# Patient Record
Sex: Male | Born: 1950 | Race: White | Hispanic: No | Marital: Single | Smoking: Never smoker
Health system: Southern US, Community
[De-identification: ages and names within clinical notes are randomized; demographics above are authoritative.]

## PROBLEM LIST (undated history)

## (undated) HISTORY — PX: CHOLECYSTECTOMY: SHX55

---

## 2000-08-05 ENCOUNTER — Encounter: Payer: Self-pay | Admitting: Emergency Medicine

## 2000-08-05 ENCOUNTER — Emergency Department (HOSPITAL_COMMUNITY): Admission: EM | Admit: 2000-08-05 | Discharge: 2000-08-05 | Payer: Self-pay | Admitting: Emergency Medicine

## 2009-09-13 ENCOUNTER — Ambulatory Visit (HOSPITAL_COMMUNITY): Admission: EM | Admit: 2009-09-13 | Discharge: 2009-09-15 | Payer: Self-pay | Admitting: Emergency Medicine

## 2009-09-14 ENCOUNTER — Encounter (INDEPENDENT_AMBULATORY_CARE_PROVIDER_SITE_OTHER): Payer: Self-pay | Admitting: General Surgery

## 2010-06-23 LAB — COMPREHENSIVE METABOLIC PANEL
ALT: 101 U/L — ABNORMAL HIGH (ref 0–53)
AST: 142 U/L — ABNORMAL HIGH (ref 0–37)
Albumin: 4.4 g/dL (ref 3.5–5.2)
Alkaline Phosphatase: 80 U/L (ref 39–117)
BUN: 13 mg/dL (ref 6–23)
CO2: 24 mEq/L (ref 19–32)
Calcium: 9.2 mg/dL (ref 8.4–10.5)
Chloride: 105 mEq/L (ref 96–112)
Creatinine, Ser: 0.97 mg/dL (ref 0.4–1.5)
GFR calc Af Amer: >60 mL/min (ref >60)
GFR calc non Af Amer: >60 mL/min (ref >60)
Glucose, Bld: 149 mg/dL — ABNORMAL HIGH (ref 70–99)
Potassium: 3.9 mEq/L (ref 3.5–5.1)
Sodium: 139 mEq/L (ref 135–145)
Total Bilirubin: 1.7 mg/dL — ABNORMAL HIGH (ref 0.3–1.2)
Total Protein: 6.9 g/dL (ref 6.0–8.3)

## 2010-06-23 LAB — HEPATIC FUNCTION PANEL
AST: 152 U/L — ABNORMAL HIGH (ref 0–37)
Albumin: 3.8 g/dL (ref 3.5–5.2)
Alkaline Phosphatase: 70 U/L (ref 39–117)
Bilirubin, Direct: 0.3 mg/dL (ref 0.0–0.3)
Indirect Bilirubin: 1.6 mg/dL — ABNORMAL HIGH (ref 0.3–0.9)
Total Bilirubin: 1.9 mg/dL — ABNORMAL HIGH (ref 0.3–1.2)
Total Bilirubin: 2.4 mg/dL — ABNORMAL HIGH (ref 0.3–1.2)
Total Protein: 6.1 g/dL (ref 6.0–8.3)

## 2010-06-23 LAB — URINALYSIS, ROUTINE W REFLEX MICROSCOPIC
Bilirubin Urine: NEGATIVE
Glucose, UA: NEGATIVE mg/dL
Hgb urine dipstick: NEGATIVE
Ketones, ur: 15 mg/dL — AB
Nitrite: NEGATIVE
Protein, ur: NEGATIVE mg/dL
Specific Gravity, Urine: 1.025 (ref 1.005–1.030)
Urobilinogen, UA: 0.2 mg/dL (ref 0.0–1.0)
pH: 6.5 (ref 5.0–8.0)

## 2010-06-23 LAB — CBC
HCT: 40.3 % (ref 39.0–52.0)
HCT: 46.3 % (ref 39.0–52.0)
Hemoglobin: 13.6 g/dL (ref 13.0–17.0)
Hemoglobin: 14.5 g/dL (ref 13.0–17.0)
Hemoglobin: 15.8 g/dL (ref 13.0–17.0)
MCHC: 33.8 g/dL (ref 30.0–36.0)
MCHC: 33.9 g/dL (ref 30.0–36.0)
MCHC: 34.1 g/dL (ref 30.0–36.0)
MCV: 96.1 fL (ref 78.0–100.0)
MCV: 96.4 fL (ref 78.0–100.0)
Platelets: 216 10*3/uL (ref 150–400)
RBC: 4.18 MIL/uL — ABNORMAL LOW (ref 4.22–5.81)
RBC: 4.82 MIL/uL (ref 4.22–5.81)
RDW: 12.4 % (ref 11.5–15.5)
RDW: 12.7 % (ref 11.5–15.5)
RDW: 12.8 % (ref 11.5–15.5)
WBC: 15.8 10*3/uL — ABNORMAL HIGH (ref 4.0–10.5)

## 2010-06-23 LAB — BASIC METABOLIC PANEL
CO2: 29 mEq/L (ref 19–32)
Calcium: 8.6 mg/dL (ref 8.4–10.5)
Calcium: 8.6 mg/dL (ref 8.4–10.5)
Chloride: 108 mEq/L (ref 96–112)
Creatinine, Ser: 1.06 mg/dL (ref 0.4–1.5)
GFR calc Af Amer: >60 mL/min (ref >60)
GFR calc Af Amer: >60 mL/min (ref >60)
GFR calc non Af Amer: >60 mL/min (ref >60)
Glucose, Bld: 117 mg/dL — ABNORMAL HIGH (ref 70–99)
Glucose, Bld: 128 mg/dL — ABNORMAL HIGH (ref 70–99)
Potassium: 4.7 mEq/L (ref 3.5–5.1)
Sodium: 141 mEq/L (ref 135–145)
Sodium: 142 mEq/L (ref 135–145)

## 2010-06-23 LAB — DIFFERENTIAL
Basophils Absolute: 0 10*3/uL (ref 0.0–0.1)
Basophils Relative: 0 % (ref 0–1)
Eosinophils Relative: 0 % (ref 0–5)
Lymphocytes Relative: 6 % — ABNORMAL LOW (ref 12–46)
Neutro Abs: 14.4 10*3/uL — ABNORMAL HIGH (ref 1.7–7.7)

## 2010-06-23 LAB — LIPASE, BLOOD: Lipase: 21 U/L (ref 11–59)

## 2011-10-13 IMAGING — US US ABDOMEN COMPLETE
1 series · 14 of 25 positions shown · non-contrast
Comparison: None

CLINICAL DATA: Abdominal pain.  Nausea and vomiting.

COMPLETE ABDOMINAL ULTRASOUND

[Series 1: us abdomen complete · 0.31mm/px · 14 of 100 slices shown]
[im 1/100]
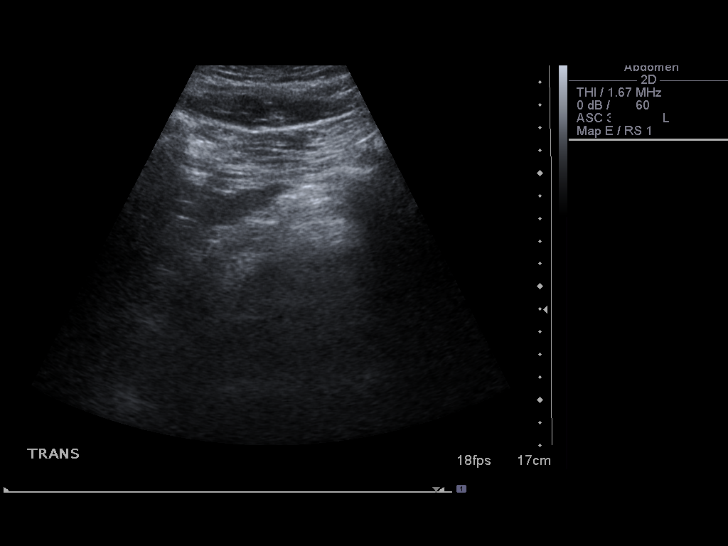
[im 9/100]
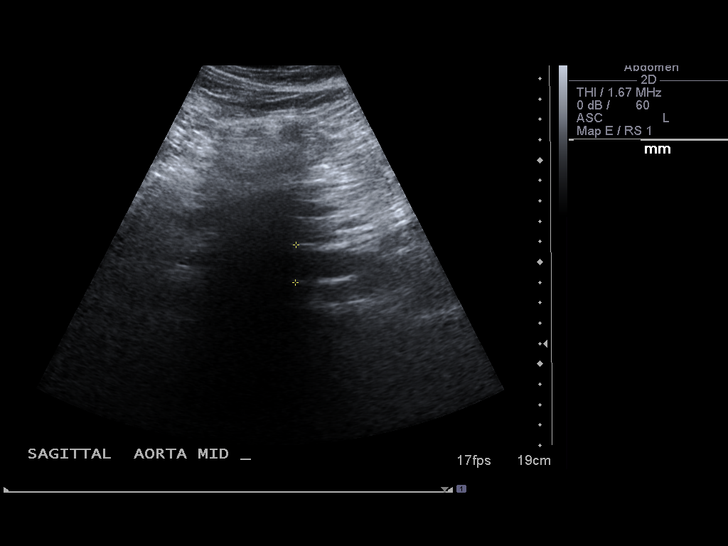
[im 17/100]
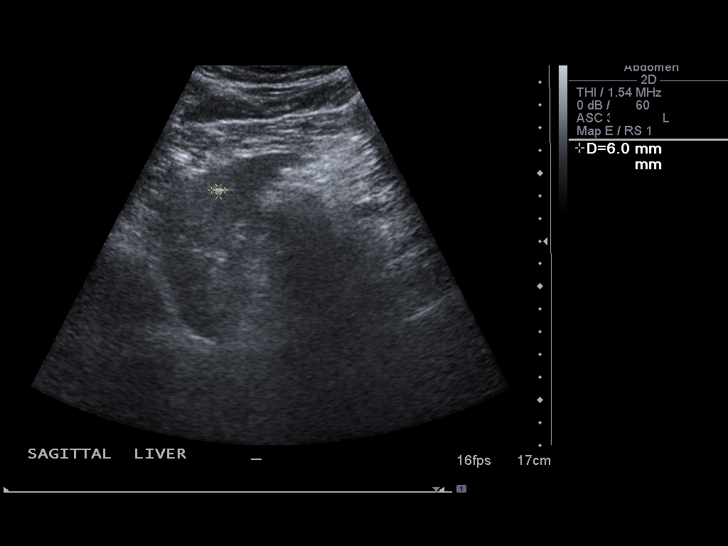
[im 25/100]
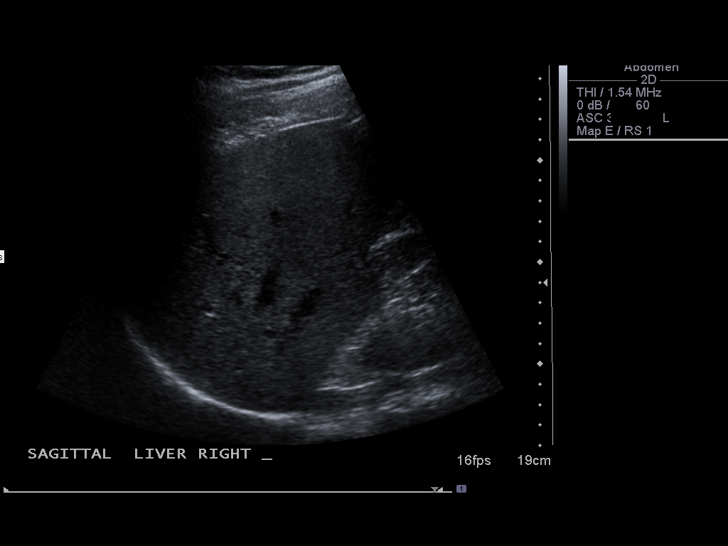
[im 34/100]
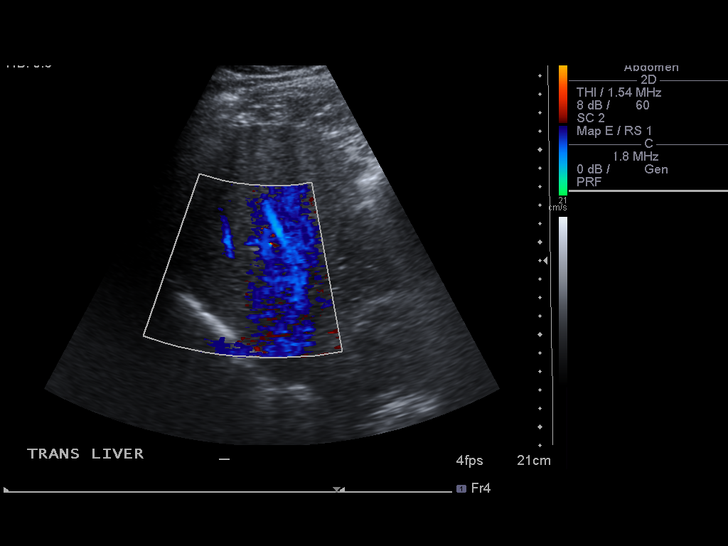
[im 38/100]
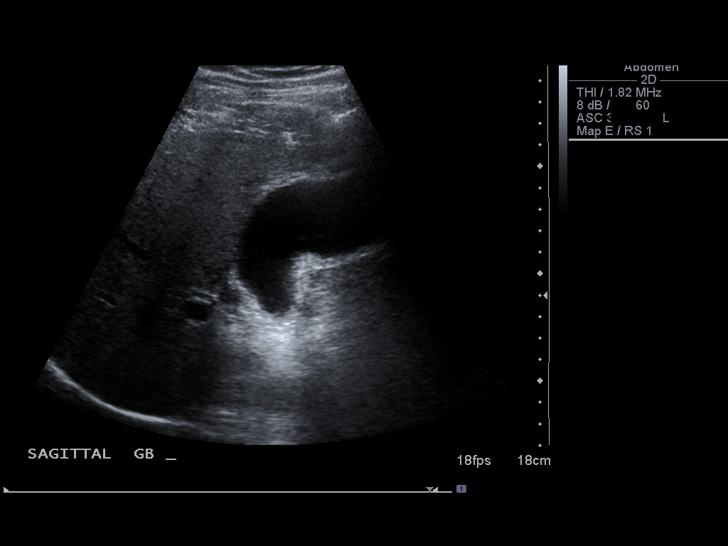
[im 46/100]
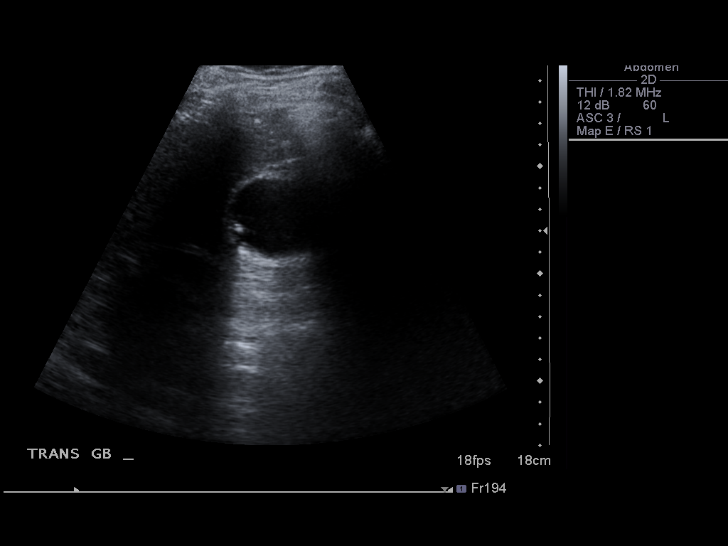
[im 54/100]
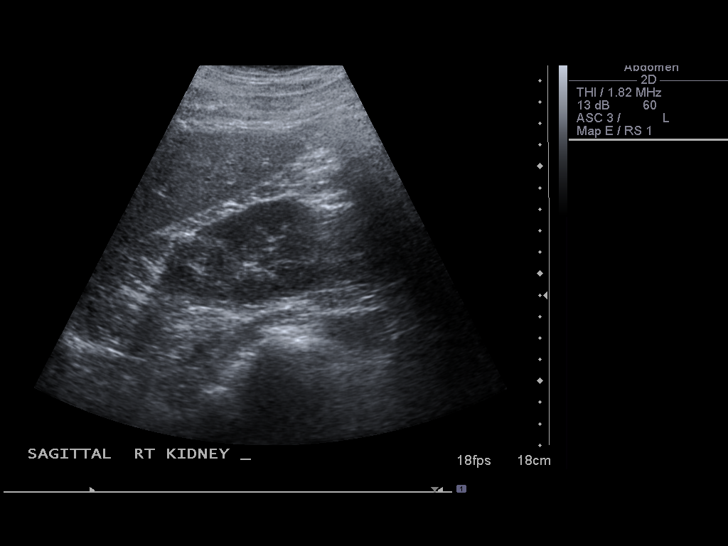
[im 62/100]
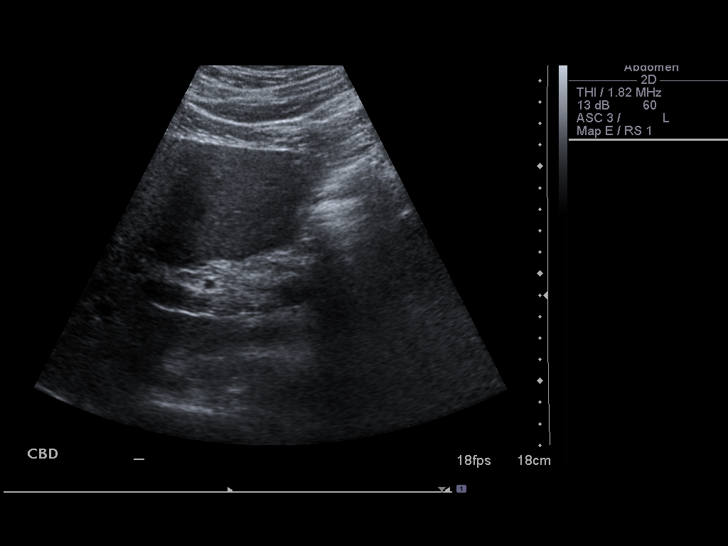
[im 67/100]
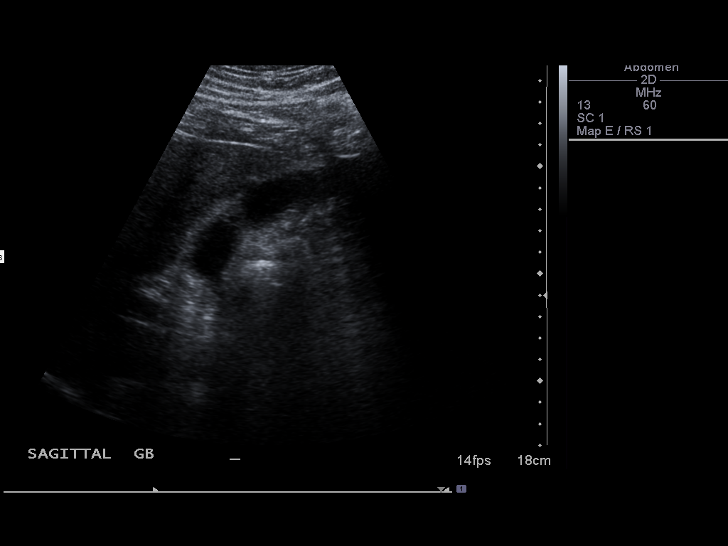
[im 75/100]
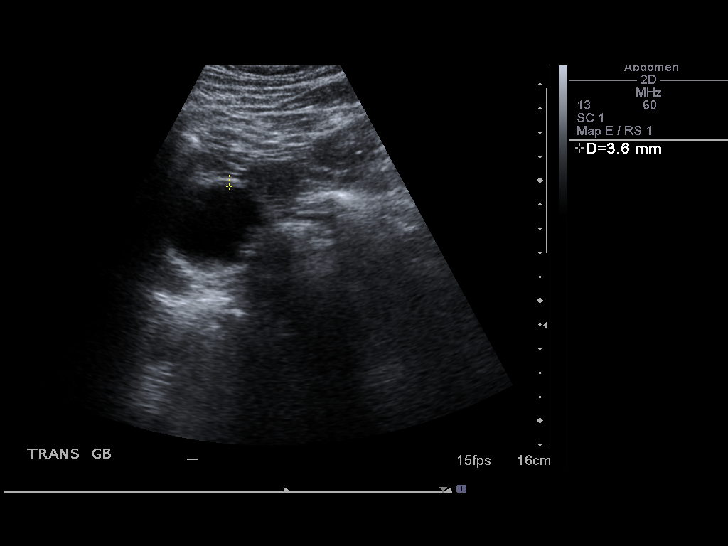
[im 83/100]
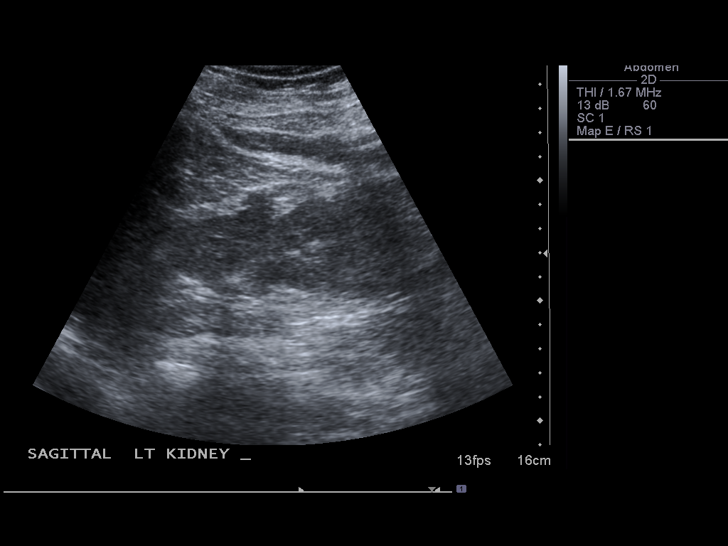
[im 91/100]
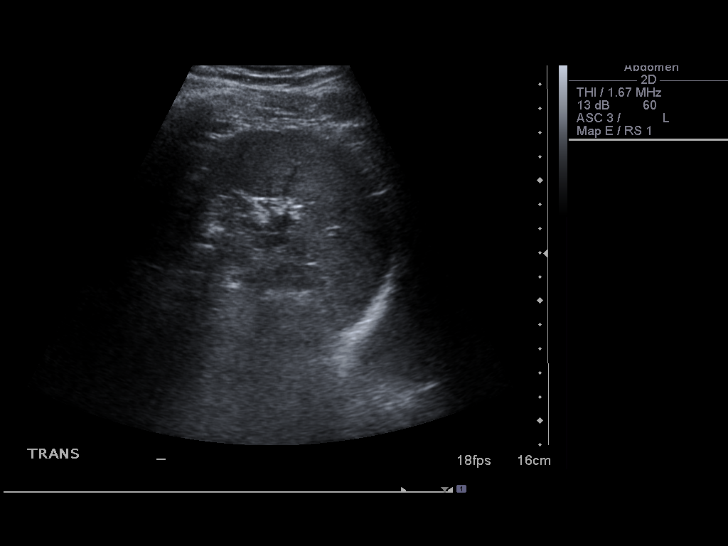
[im 100/100]
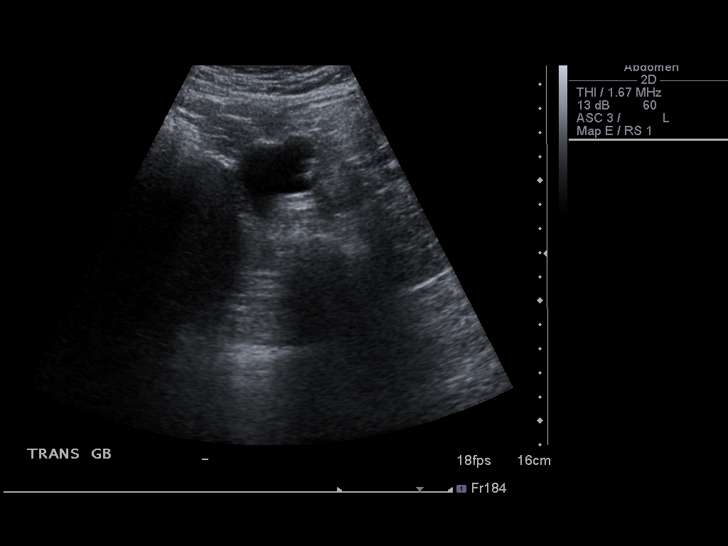

[14 of 25 positions shown; findings below may reference images not displayed]

FINDINGS: Gallbladder:  The gallbladder contains multiple small stones.
There is no gallbladder wall thickening or pericholecystic fluid.
No Murphy's sign.

Common bile duct:  Normal at 4 mm.

Liver:  Somewhat echogenic suggesting fatty change.  6 x 4 x 7 mm
echogenic focus in the left lobe likely to represent a small
hemangioma.

IVC:  Normal

Pancreas:  Not well seen because of overlying bowel gas.

Spleen:  Normal at 7.2 cm.

Right Kidney:  Normal at 12.2 cm.  Echogenicity is normal.  No
cyst, mass, stone or hydronephrosis.

Left Kidney:  Likewise normal at 12.5 cm.

Abdominal aorta:  No aneurysm seen.  Portions obscured by bowel
gas.

No ascites.
IMPRESSION: Multiple small stones layering dependently in the gallbladder.  No
sign of cholecystitis or obstruction at this moment by sonography
however. Views of the sort of stones that could pass into the
biliary ductal tree.

## 2014-04-03 ENCOUNTER — Other Ambulatory Visit: Payer: Self-pay | Admitting: Internal Medicine

## 2014-05-15 ENCOUNTER — Other Ambulatory Visit: Payer: Self-pay | Admitting: Internal Medicine

## 2016-05-03 ENCOUNTER — Emergency Department (HOSPITAL_COMMUNITY)
Admission: EM | Admit: 2016-05-03 | Discharge: 2016-05-03 | Disposition: A | Discharge status: Home / Self Care | Payer: PRIVATE HEALTH INSURANCE | Attending: Emergency Medicine | Admitting: Emergency Medicine

## 2016-05-03 ENCOUNTER — Encounter (HOSPITAL_COMMUNITY): Payer: Self-pay | Admitting: Emergency Medicine

## 2016-05-03 DIAGNOSIS — R197 Diarrhea, unspecified: Secondary | ICD-10-CM | POA: Diagnosis not present

## 2016-05-03 DIAGNOSIS — Z79899 Other long term (current) drug therapy: Secondary | ICD-10-CM | POA: Insufficient documentation

## 2016-05-03 DIAGNOSIS — R55 Syncope and collapse: Secondary | ICD-10-CM

## 2016-05-03 DIAGNOSIS — E860 Dehydration: Secondary | ICD-10-CM | POA: Insufficient documentation

## 2016-05-03 LAB — CBC
HEMATOCRIT: 42.5 % (ref 39.0–52.0)
HEMOGLOBIN: 14.5 g/dL (ref 13.0–17.0)
MCH: 31.3 pg (ref 26.0–34.0)
MCHC: 34.1 g/dL (ref 30.0–36.0)
MCV: 91.8 fL (ref 78.0–100.0)
Platelets: 254 10*3/uL (ref 150–400)
RBC: 4.63 MIL/uL (ref 4.22–5.81)
RDW: 12.3 % (ref 11.5–15.5)
WBC: 14.9 10*3/uL — AB (ref 4.0–10.5)

## 2016-05-03 LAB — BASIC METABOLIC PANEL
Anion gap: 7 (ref 5–15)
BUN: 15 mg/dL (ref 6–20)
CHLORIDE: 108 mmol/L (ref 101–111)
CO2: 23 mmol/L (ref 22–32)
CREATININE: 1.11 mg/dL (ref 0.61–1.24)
Calcium: 8.5 mg/dL — ABNORMAL LOW (ref 8.9–10.3)
GFR calc non Af Amer: >60 mL/min (ref >60)
Glucose, Bld: 130 mg/dL — ABNORMAL HIGH (ref 65–99)
POTASSIUM: 4.6 mmol/L (ref 3.5–5.1)
SODIUM: 138 mmol/L (ref 135–145)

## 2016-05-03 LAB — CBG MONITORING, ED: Glucose-Capillary: 131 mg/dL — ABNORMAL HIGH (ref 65–99)

## 2016-05-03 MED ORDER — SODIUM CHLORIDE 0.9 % IV BOLUS (SEPSIS)
1000.0000 mL | Freq: Once | INTRAVENOUS | Status: AC
  Administered 2016-05-03: 1000 mL via INTRAVENOUS

## 2016-05-03 NOTE — ED Notes (Signed)
Pt ambulated to restroom by self. Pt gait steady and denies dizziness.

## 2016-05-03 NOTE — ED Notes (Signed)
Bed: [id number] Expected date: 05/03/16 Expected time: 11:59 AM Means of arrival: Ambulance Comments: Diarrhea

## 2016-05-03 NOTE — ED Triage Notes (Signed)
Per [facility] pt was at church today with diarrhea onset of this morning. Pt reports feeling dizzy, when EMS arrived pt was pale and diaphoretic. When sat pt up pt went unresponsive for a few seconds. Pt was given 250 CC NS. Denies chest pain.

## 2016-05-03 NOTE — ED Provider Notes (Signed)
[facility]-EMERGENCY DEPT Provider Note   CSN: [id number] Arrival date & time: 05/03/16  1154     History   Chief Complaint Chief Complaint  Patient presents with  . Loss of Consciousness    HPI Noah Macdonald is a 66 y.o. male.  He presents for evaluation of diarrhea onset today. He had several episodes before when discharged today, and some at discharge, which was associated with a sensation of dizziness. He was sitting on a bench, when paramedics arrived and he subsequently passed out. He was apparently taken to the floor quickly, whereupon he injured his lower back. He denies headache, vomiting or abdominal pain at this time. He has been somewhat nauseated today. At the time of evaluation, he is thirsty. He denies fever. No known abnormal food contact, or known sick contact. There are no other known modifying factors.    HPI  History reviewed. No pertinent past medical history.  There are no active problems to display for this patient.   Past Surgical History:  Procedure Laterality Date  . CHOLECYSTECTOMY         Home Medications    Prior to Admission medications   Medication Sig Start Date End Date Taking? Authorizing Provider  acetaminophen (TYLENOL) 325 MG tablet Take 650 mg by mouth every 6 (six) hours as needed.   Yes Historical Provider, MD  Chlorphen-Phenyleph-ASA (ALKA-SELTZER PLUS COLD PO) Take 1 capsule by mouth daily as needed (cold).   Yes Historical Provider, MD  TURMERIC PO Take 1 tablet by mouth daily.   Yes Historical Provider, MD    Family History No family history on file.  Social History Social History  Substance Use Topics  . Smoking status: Never Smoker  . Smokeless tobacco: Never Used  . Alcohol use Yes     Comment: wine every once in a while     Allergies   Patient has no known allergies.   Review of Systems Review of Systems  All other systems reviewed and are negative.    Physical Exam Updated Vital Signs BP 125/70    Pulse 93   Temp 98.8 F (37.1 C) (Oral)   Resp 20   SpO2 96%   Physical Exam  Constitutional: He is oriented to person, place, and time. He appears well-developed and well-nourished. No distress.  HENT:  Head: Normocephalic and atraumatic.  Right Ear: External ear normal.  Left Ear: External ear normal.  Eyes: Conjunctivae and EOM are normal. Pupils are equal, round, and reactive to light.  Neck: Normal range of motion and phonation normal. Neck supple.  Cardiovascular: Normal rate, regular rhythm and normal heart sounds.   Pulmonary/Chest: Effort normal and breath sounds normal. He exhibits no bony tenderness.  Abdominal: Soft. There is no tenderness.  Musculoskeletal:  He is able sit on the bed, but this causes pain in the lower back. No deformity of the cervical, thoracic or lumbar spines.  Neurological: He is alert and oriented to person, place, and time. No cranial nerve deficit or sensory deficit. He exhibits normal muscle tone. Coordination normal.  Skin: Skin is warm, dry and intact.  Psychiatric: He has a normal mood and affect. His behavior is normal. Judgment and thought content normal.  Nursing note and vitals reviewed.    ED Treatments / Results  Labs (all labs ordered are listed, but only abnormal results are displayed) Labs Reviewed  BASIC METABOLIC PANEL - Abnormal; Notable for the following:       Result Value   Glucose,  Bld 130 (*)    Calcium 8.5 (*)    All other components within normal limits  CBC - Abnormal; Notable for the following:    WBC 14.9 (*)    All other components within normal limits  CBG MONITORING, ED - Abnormal; Notable for the following:    Glucose-Capillary 131 (*)    All other components within normal limits    EKG  EKG Interpretation  Date/Time:  Sunday January 28 Macdonald 12:09:24 EST Ventricular Rate:  84 PR Interval:    QRS Duration: 96 QT Interval:  367 QTC Calculation: 434 R Axis:   69 Text Interpretation:  Sinus rhythm  since last tracing no significant change Confirmed by Noah ShyWENTZ  MD, Noah Macdonald ([id number]) on 1/28/Macdonald 3:22:56 PM       Radiology No results found.  Procedures Procedures (including critical care time)  Medications Ordered in ED Medications  sodium chloride 0.9 % bolus 1,000 mL (0 mLs Intravenous Stopped 05/03/16 1655)     Initial Impression / Assessment and Plan / ED Course  I have reviewed the triage vital signs and the nursing notes.  Pertinent labs & imaging results that were available during my care of the patient were reviewed by me and considered in my medical decision making (see chart for details).  Clinical Course as of May 03 1706  Noah Macdonald  1651 He is tolerating liquids orally, and feeling better. He received extra IV fluids because of orthostasis.  [EW]    Clinical Course User Index [EW] Noah BaleElliott Johnice Riebe, MD    Medications  sodium chloride 0.9 % bolus 1,000 mL (0 mLs Intravenous Stopped 05/03/16 1655)    Patient Vitals for the past 24 hrs:  BP Temp Temp src Pulse Resp SpO2  05/03/16 1701 - 98.8 F (37.1 C) Oral - - -  05/03/16 1630 125/70 - - 93 20 96 %  05/03/16 1600 129/65 - - 90 22 96 %  05/03/16 1530 132/71 - - 87 18 98 %  05/03/16 1502 125/57 - - 86 17 98 %  05/03/16 1430 132/69 - - 87 18 99 %  05/03/16 1400 134/69 - - 88 20 100 %  05/03/16 1335 116/62 - - 86 19 99 %  05/03/16 1230 131/58 - - 87 20 100 %  05/03/16 1207 131/65 97.9 F (36.6 C) Oral 86 20 99 %    5:08 PM Reevaluation with update and discussion. After initial assessment and treatment, an updated evaluation reveals He is comfortable now. Orthostatics improved. Findings discussed with patient and wife, all questions answered. Noah Macdonald    Final Clinical Impressions(s) / ED Diagnoses   Final diagnoses:  Diarrhea, unspecified type  Dehydration  Syncope, unspecified syncope type    Nonspecific diarrhea with dehydration, and a brief episode of syncope. Associated hypotension,  improved with treatment of IV fluids. Doubt serious bacterial infection. Metabolic instability or impending vascular collapse.  Nursing Notes Reviewed/ Care Coordinated Applicable Imaging Reviewed Interpretation of Laboratory Data incorporated into ED treatment  The patient appears reasonably screened and/or stabilized for discharge and I doubt any other medical condition or other [facility] requiring further screening, evaluation, or treatment in the ED at this time prior to discharge.  Plan: Home Medications- continue; Home Treatments- rest; return here if the recommended treatment, does not improve the symptoms; Recommended follow up- PCP prn   New Prescriptions New Prescriptions   No medications on file     Noah BaleElliott Alaiza Yau, MD 05/03/16 1709

## 2016-05-03 NOTE — Discharge Instructions (Signed)
Stat low fiber diet, for 2 or 3 days.  Drink plenty of fluids.  Be careful when you stand up, to be ready to sit down, if you feel dizzy.

## 2021-04-30 ENCOUNTER — Other Ambulatory Visit (HOSPITAL_BASED_OUTPATIENT_CLINIC_OR_DEPARTMENT_OTHER): Payer: Self-pay | Admitting: Family Medicine

## 2021-04-30 DIAGNOSIS — R1033 Periumbilical pain: Secondary | ICD-10-CM

## 2021-05-01 ENCOUNTER — Ambulatory Visit (HOSPITAL_BASED_OUTPATIENT_CLINIC_OR_DEPARTMENT_OTHER)
Admission: RE | Admit: 2021-05-01 | Discharge: 2021-05-01 | Disposition: A | Discharge status: Home / Self Care | Payer: Medicare HMO | Source: Ambulatory Visit | Attending: Family Medicine | Admitting: Family Medicine

## 2021-05-01 ENCOUNTER — Encounter (HOSPITAL_BASED_OUTPATIENT_CLINIC_OR_DEPARTMENT_OTHER): Payer: Self-pay

## 2021-05-01 ENCOUNTER — Other Ambulatory Visit: Payer: Self-pay

## 2021-05-01 DIAGNOSIS — R1033 Periumbilical pain: Secondary | ICD-10-CM | POA: Insufficient documentation

## 2021-05-01 LAB — POCT I-STAT CREATININE: Creatinine, Ser: 1.8 mg/dL — ABNORMAL HIGH (ref 0.61–1.24)

## 2021-05-01 MED ORDER — IOHEXOL 300 MG/ML  SOLN
85.0000 mL | Freq: Once | INTRAMUSCULAR | Status: AC | PRN
  Administered 2021-05-01: 75 mL via INTRAVENOUS

## 2021-07-01 ENCOUNTER — Encounter: Payer: Self-pay | Admitting: *Deleted

## 2021-07-01 NOTE — Progress Notes (Signed)
Spoke with patient and discussed New Patient appt, pt provided information regarding his care up to this time. ? ?Called back and left message for New Patient appt for 07/09/21 at 1:40 pm with Dr Truett Perna. ?

## 2021-07-02 ENCOUNTER — Encounter: Payer: Self-pay | Admitting: *Deleted

## 2021-07-09 ENCOUNTER — Ambulatory Visit: Payer: Medicare HMO | Admitting: Oncology

## 2023-05-31 IMAGING — CT CT ABD-PELV W/ CM
2 of 5 series · 16 of 46 positions shown, 18 images · IV contrast (APPLIED)
Comparison: None.

CLINICAL DATA: Periumbilical pain over the last week. Previous
cholecystectomy.

EXAM:
CT ABDOMEN AND PELVIS WITH CONTRAST
TECHNIQUE: Multidetector CT imaging of the abdomen and pelvis was performed
using the standard protocol following bolus administration of
intravenous contrast.

[Series 2: abd pel w · axial · 0.81mm/px · z∈[-295,+160]mm · 13 of 105 slices shown, 15 images]
[im 7/105  soft-tissue]
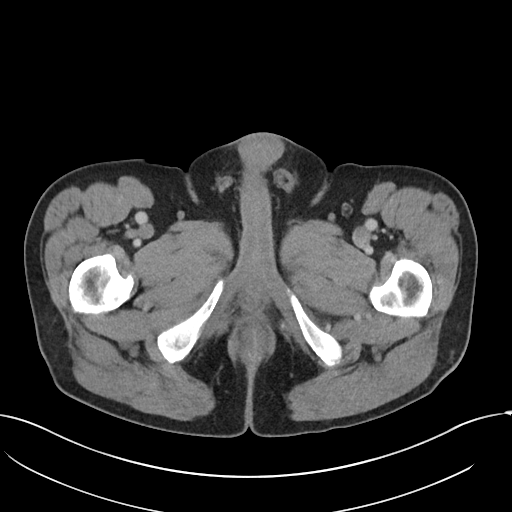
[im 7/105  bone]
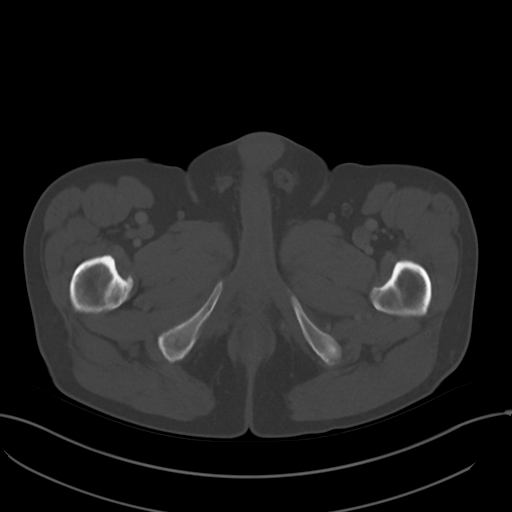
[im 14/105  soft-tissue]
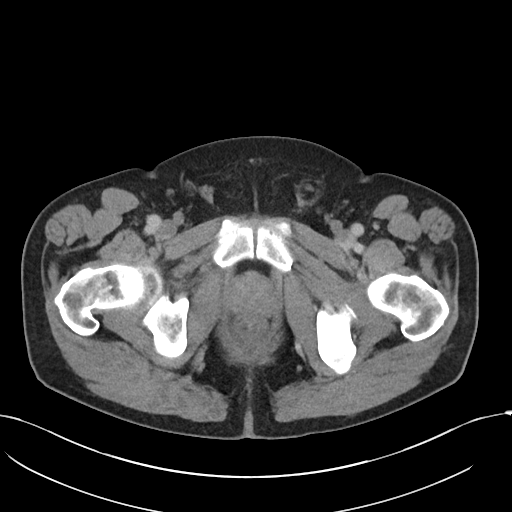
[im 21/105  soft-tissue]
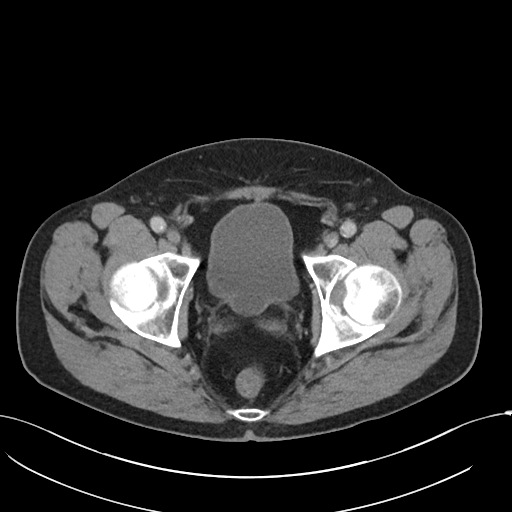
[im 28/105  soft-tissue]
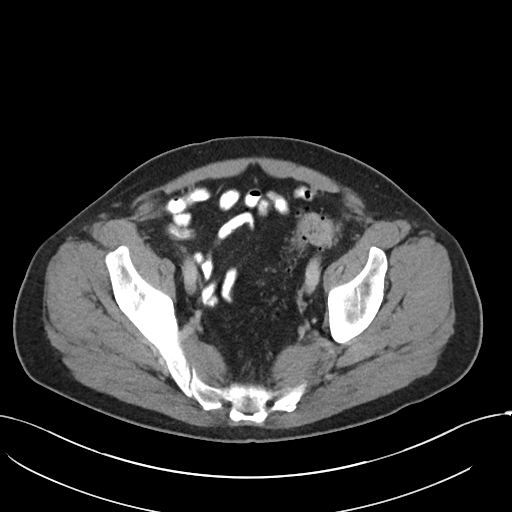
[im 35/105  soft-tissue]
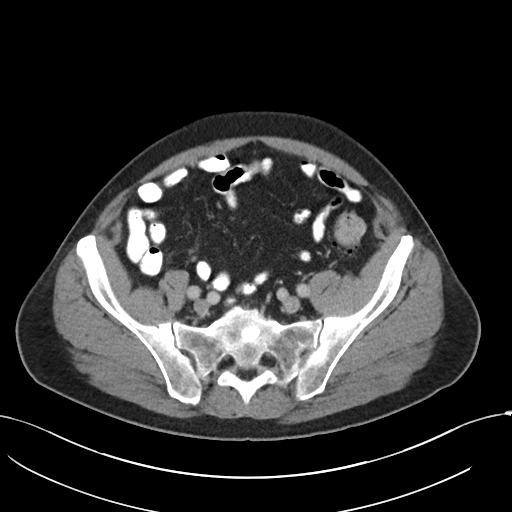
[im 42/105  soft-tissue]
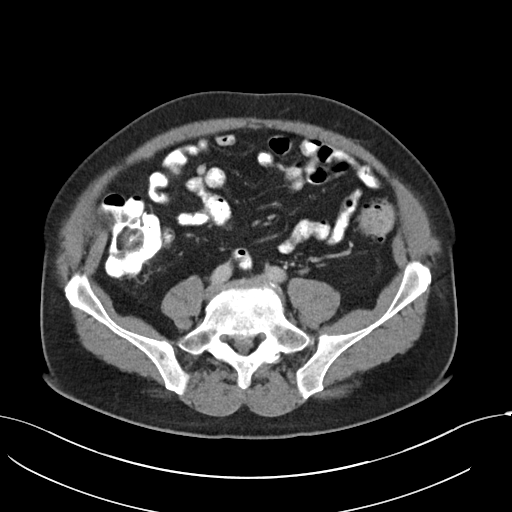
[im 56/105  soft-tissue]
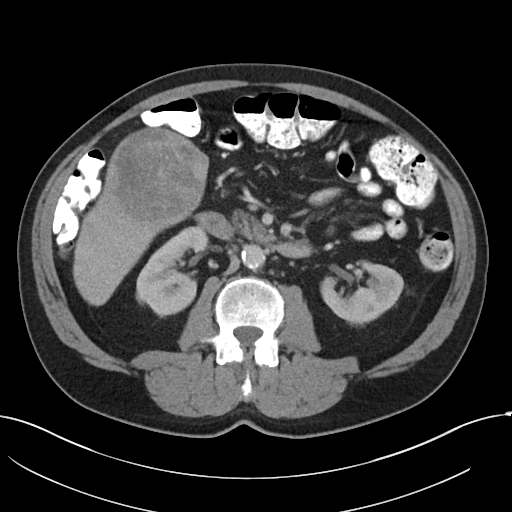
[im 63/105  soft-tissue]
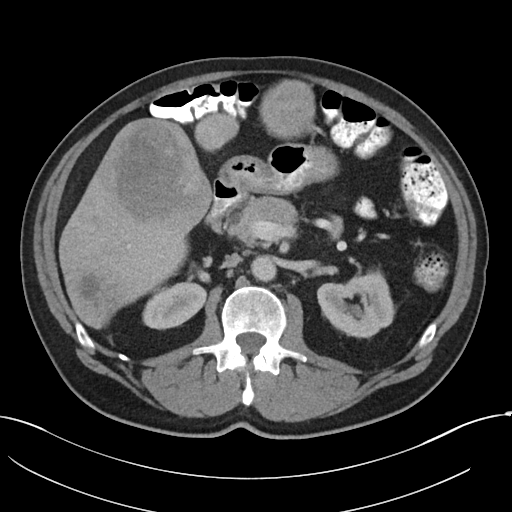
[im 70/105  soft-tissue]
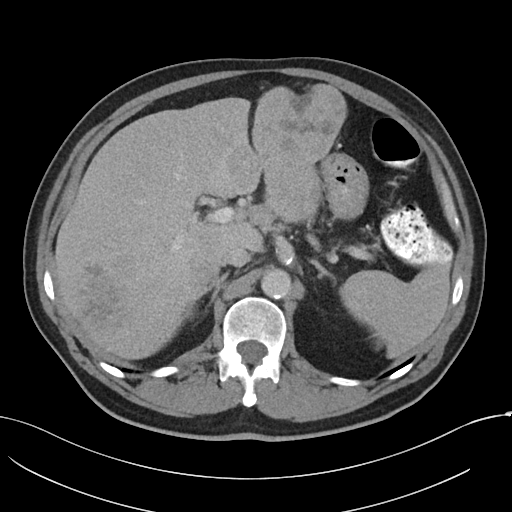
[im 70/105  bone]
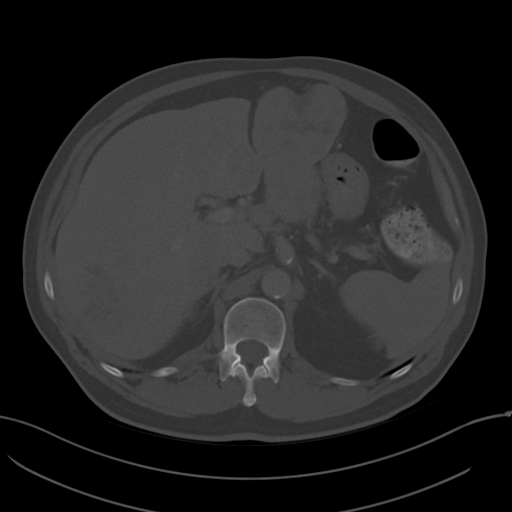
[im 77/105  soft-tissue]
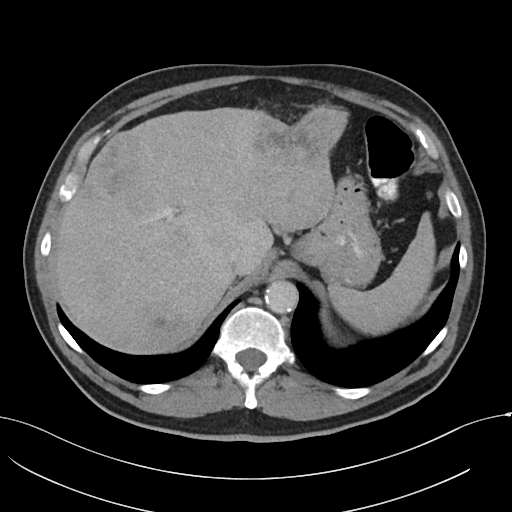
[im 84/105  soft-tissue]
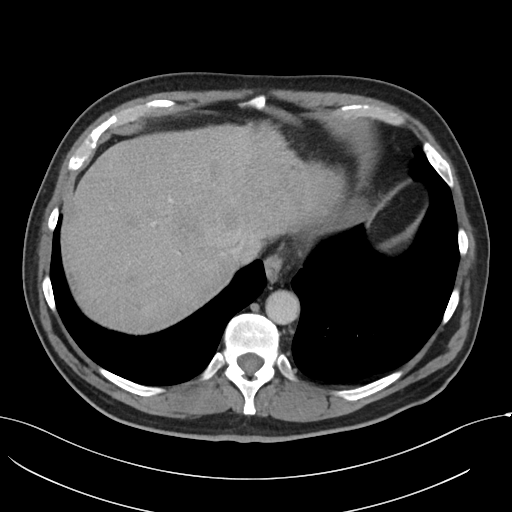
[im 91/105  soft-tissue]
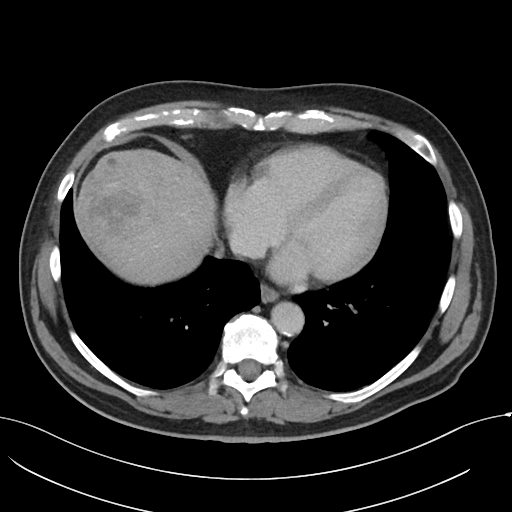
[im 98/105  soft-tissue]
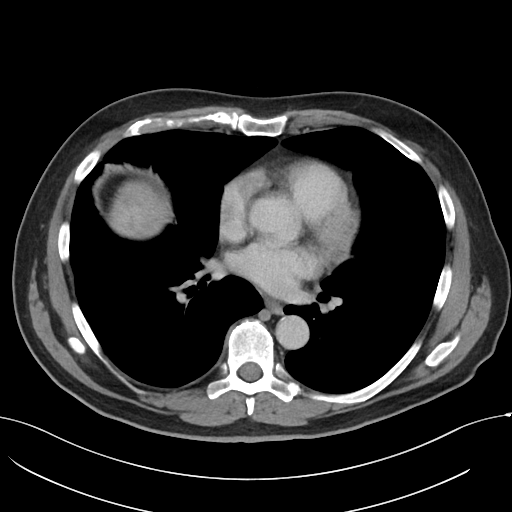

[Series 5: coronal · coronal · 0.82mm/px · 3 of 110 slices shown]
[im 37/110  soft-tissue]
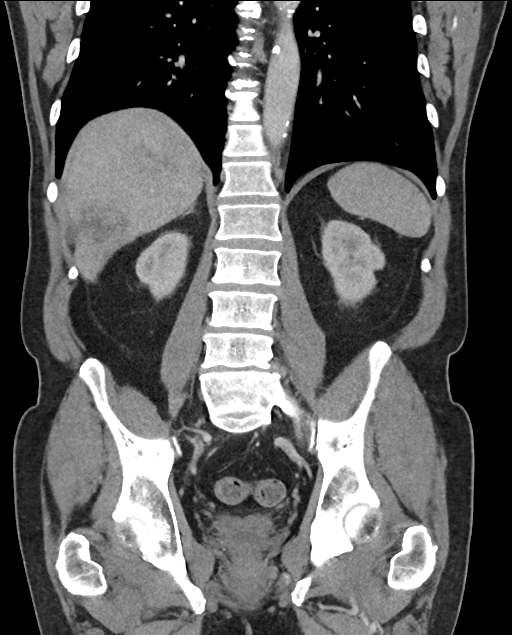
[im 49/110  soft-tissue]
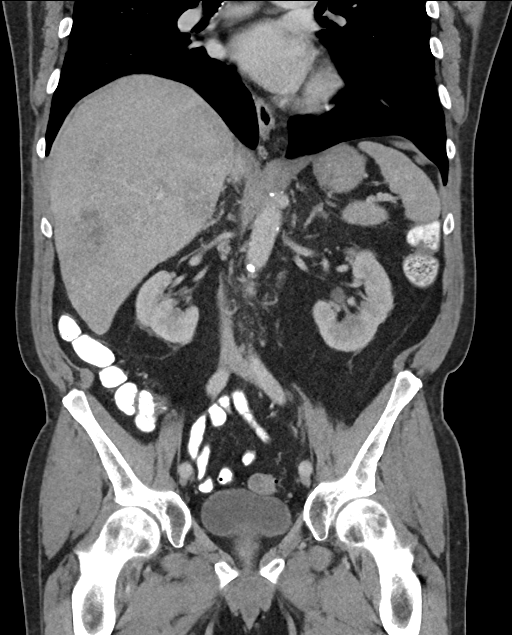
[im 61/110  soft-tissue]
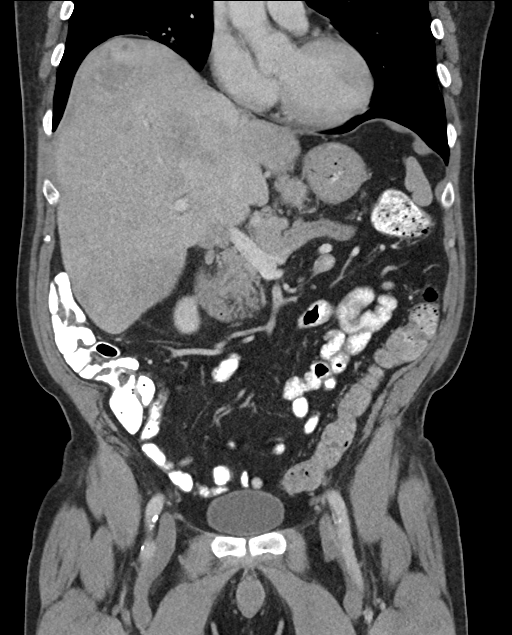

[16 of 46 positions shown; findings below may reference images not displayed]

RADIATION DOSE REDUCTION: This exam was performed according to the
departmental dose-optimization program which includes automated
exposure control, adjustment of the mA and/or kV according to
patient size and/or use of iterative reconstruction technique.

CONTRAST:  75mL OMNIPAQUE IOHEXOL 300 MG/ML  SOLN
FINDINGS: Lower chest: Lung bases are clear except for linear atelectasis or
scarring in the right middle lobe. No pulmonary nodule seen in the
lower lungs.

Hepatobiliary: Multiple masses scattered throughout the liver with
varying levels of necrosis consistent with advanced hepatic
metastatic disease. The single largest confluent lesion is within
the lateral segment of the left lobe measuring up to 12 cm in
diameter. 9 cm lesion at the caudal tip of the right lobe. Previous
cholecystectomy. No sign of biliary obstruction.

Pancreas: 3.5 cm mass of the proximal body of the pancreas
consistent with pancreatic cancer.

Spleen: Normal

Adrenals/Urinary Tract: Adrenal glands are normal. Kidneys are
normal. Bladder is normal.

Stomach/Bowel: Stomach and small intestine are normal. There is
diverticulosis of the left colon without evidence of diverticulitis.
No bowel mass identified.

Vascular/Lymphatic: Aortic atherosclerosis. No aneurysm. IVC is
normal. No retroperitoneal adenopathy. Several enlarged lymph nodes
in the porta hepatis.

Reproductive: Normal

Other: No free fluid or air.

Musculoskeletal: Sclerotic foci within the L5 and S1 vertebral body.
This would be an unusual manifestation of pancreatic cancer but that
possibility is not excluded. No lytic bone lesions.
IMPRESSION: Advanced and widespread metastatic disease throughout the liver.

3.5 cm mass of the proximal body of the pancreas consistent with
pancreatic carcinoma. Metastatic nodes in the porta hepatis.

Sclerotic foci within the L5 and S1 vertebral bodies of
indeterminate nature. This would be an unusual manifestation of
prostate cancer but that possibility is not excluded.

## 2023-09-01 ENCOUNTER — Ambulatory Visit: Attending: Orthopedic Surgery

## 2023-09-01 DIAGNOSIS — M5459 Other low back pain: Secondary | ICD-10-CM | POA: Diagnosis present

## 2023-09-01 NOTE — Therapy (Signed)
 OUTPATIENT PHYSICAL THERAPY THORACOLUMBAR EVALUATION   Patient Name: Noah Macdonald MRN: [id number] DOB:1950/11/01, 73 y.o., male Today's Date: 09/01/2023  END OF SESSION:  PT End of Session - 09/01/23 1015     Visit Number 1    Number of Visits 8    Date for PT Re-Evaluation 10/15/23    PT Start Time 1018    PT Stop Time 1055    PT Time Calculation (min) 37 min    Activity Tolerance Patient tolerated treatment well    Behavior During Therapy [facility] for tasks assessed/performed             History reviewed. No pertinent past medical history. Past Surgical History:  Procedure Laterality Date   CHOLECYSTECTOMY     There are no active problems to display for this patient.  REFERRING PROVIDER: Virl Grimes, MD  REFERRING DIAG: Lumbar facet hypertrophy   Rationale for Evaluation and Treatment: Rehabilitation  THERAPY DIAG:  Other low back pain  ONSET DATE: 1 month ago  SUBJECTIVE:                                                                                                                                                                                           SUBJECTIVE STATEMENT: Patient reports that he began having back pain about one month ago with no known cause. His pain occurs primarily when he lays down. This makes it almost impossible to sleep at night. However, he can sleep some when he takes his pain medication. He had pain like this previously, but this was about 40 years ago. This pain eventually "just went away."   PERTINENT HISTORY:  Hypertension and cancer  PAIN:  Are you having pain? Yes: NPRS scale: Current: 1/10 Best: 0/10 Worst: 5/10 Pain location: low back Pain description: aching Aggravating factors: prone, bending over Relieving factors: medication  PRECAUTIONS: None  RED FLAGS: None   WEIGHT BEARING RESTRICTIONS: No  FALLS:  Has patient fallen in last 6 months? No  LIVING ENVIRONMENT: Lives with: lives with their  spouse Lives in: House/apartment  OCCUPATION: retired  PLOF: Independent  PATIENT GOALS: improved sleep and reduced pain  NEXT MD VISIT: after physical therapy  OBJECTIVE:  Note: Objective measures were completed at Evaluation unless otherwise noted.  PATIENT SURVEYS:  Modified Oswestry 12% disability   COGNITION: Overall cognitive status: Within functional limits for tasks assessed     SENSATION: Patient reports no numbness or tingling  POSTURE: forward head  PALPATION: TTP: right lumbar paraspinals (familiar pain)   LUMBAR JOINT MOBILITY  L1-4: WFL and nonpainful   L4-5: hypomobile and familiar pain  LUMBAR ROM:  AROM eval  Flexion 44  Extension 18  Right lateral flexion 50% limited  Left lateral flexion 50% limited  Right rotation WFL   Left rotation 25% limited   (Blank rows = not tested)  LOWER EXTREMITY ROM: WFL for activities assessed  LOWER EXTREMITY MMT:    MMT Right eval Left eval  Hip flexion 4+/5 4/5  Hip extension    Hip abduction    Hip adduction    Hip internal rotation    Hip external rotation    Knee flexion 4+/5 5/5  Knee extension 5/5 5/5  Ankle dorsiflexion 4/5 4/5  Ankle plantarflexion    Ankle inversion    Ankle eversion     (Blank rows = not tested)  GAIT: Assistive device utilized: None Level of assistance: Complete Independence Comments: no significant gait deviations  TREATMENT DATE:                                                                                                                                  PATIENT EDUCATION:  Education details: POC, prognosis, anatomy, arthritis, objective findings, and goals for physical therapy Person educated: Patient Education method: Explanation Education comprehension: verbalized understanding  HOME EXERCISE PROGRAM:   ASSESSMENT:  CLINICAL IMPRESSION: Patient is a 73 y.o. male who was seen today for physical therapy evaluation and treatment for low back pain. He  presented with low pain severity and irritability with palpation to his right lumbar paraspinals and lumbar joint mobility assessment at L4-5 reproducing his familiar pain. Recommend that he continue with skilled physical therapy to address his remaining impairments to return to his prior level of function.    OBJECTIVE IMPAIRMENTS: decreased ROM, hypomobility, impaired tone, and pain.   ACTIVITY LIMITATIONS: sleeping  PARTICIPATION LIMITATIONS: household activities  PERSONAL FACTORS: 1-2 comorbidities: Hypertension and cancer are also affecting patient's functional outcome.   REHAB POTENTIAL: Excellent  CLINICAL DECISION MAKING: Stable/uncomplicated  EVALUATION COMPLEXITY: Low   GOALS: Goals reviewed with patient? Yes  LONG TERM GOALS: Target date: 09/29/23  Patient will be independent with his HEP.  Baseline:  Goal status: INITIAL  2.  Patient will report being able to sleep without being awakened by his familiar low back pain.  Baseline:  Goal status: INITIAL  3.  Patient will improve his ODI score to 2% disability or less for improved perceived function with his daily activities.  Baseline:  Goal status: INITIAL  4.  Patient will be able to complete his daily activities without his familiar symptoms exceeding 3/10. Baseline:  Goal status: INITIAL  PLAN:  PT FREQUENCY: 2x/week  PT DURATION: 4 weeks  PLANNED INTERVENTIONS: 97164- PT Re-evaluation, 97750- Physical Performance Testing, 97110-Therapeutic exercises, 97530- Therapeutic activity, [id number]- Neuromuscular re-education, 97535- Self Care, 16109- Manual therapy, Patient/Family education, Joint mobilization, Spinal mobilization, Cryotherapy, and Moist heat.  PLAN FOR NEXT SESSION: manual therapy, lumbar stabilization, lower trunk rotations, and single/double knee to chest   Lane Pinon,  PT 09/01/2023, 4:50 PM

## 2023-09-03 ENCOUNTER — Ambulatory Visit

## 2023-09-03 DIAGNOSIS — M5459 Other low back pain: Secondary | ICD-10-CM

## 2023-09-03 NOTE — Therapy (Signed)
 OUTPATIENT PHYSICAL THERAPY THORACOLUMBAR TREATMENT   Patient Name: SEAVER MACHIA MRN: [id number] DOB:[birth year], 73 y.o., male Today's Date: 09/03/2023  END OF SESSION:  PT End of Session - 09/03/23 1149     Visit Number 2    Number of Visits 8    Date for PT Re-Evaluation 10/15/23    PT Start Time 1145    PT Stop Time 1228    PT Time Calculation (min) 43 min    Activity Tolerance Patient tolerated treatment well    Behavior During Therapy [facility] for tasks assessed/performed             History reviewed. No pertinent past medical history. Past Surgical History:  Procedure Laterality Date   CHOLECYSTECTOMY     There are no active problems to display for this patient.  REFERRING PROVIDER: Virl Grimes, MD  REFERRING DIAG: Lumbar facet hypertrophy   Rationale for Evaluation and Treatment: Rehabilitation  THERAPY DIAG:  Other low back pain  ONSET DATE: 1 month ago  SUBJECTIVE:                                                                                                                                                                                           SUBJECTIVE STATEMENT: Pt denies any pain today.    PERTINENT HISTORY:  Hypertension and cancer  PAIN:  Are you having pain? No  PRECAUTIONS: None  RED FLAGS: None   WEIGHT BEARING RESTRICTIONS: No  FALLS:  Has patient fallen in last 6 months? No  LIVING ENVIRONMENT: Lives with: lives with their spouse Lives in: House/apartment  OCCUPATION: retired  PLOF: Independent  PATIENT GOALS: improved sleep and reduced pain  NEXT MD VISIT: after physical therapy  OBJECTIVE:  Note: Objective measures were completed at Evaluation unless otherwise noted.  PATIENT SURVEYS:  Modified Oswestry 12% disability   COGNITION: Overall cognitive status: Within functional limits for tasks assessed     SENSATION: Patient reports no numbness or tingling  POSTURE: forward head  PALPATION: TTP: right  lumbar paraspinals (familiar pain)   LUMBAR JOINT MOBILITY  L1-4: WFL and nonpainful   L4-5: hypomobile and familiar pain  LUMBAR ROM:   AROM eval  Flexion 44  Extension 18  Right lateral flexion 50% limited  Left lateral flexion 50% limited  Right rotation WFL   Left rotation 25% limited   (Blank rows = not tested)  LOWER EXTREMITY ROM: WFL for activities assessed  LOWER EXTREMITY MMT:    MMT Right eval Left eval  Hip flexion 4+/5 4/5  Hip extension    Hip abduction    Hip adduction  Hip internal rotation    Hip external rotation    Knee flexion 4+/5 5/5  Knee extension 5/5 5/5  Ankle dorsiflexion 4/5 4/5  Ankle plantarflexion    Ankle inversion    Ankle eversion     (Blank rows = not tested)  GAIT: Assistive device utilized: None Level of assistance: Complete Independence Comments: no significant gait deviations  TREATMENT DATE:                                                                                                                                 09/03/23                                 EXERCISE LOG  Exercise Repetitions and Resistance Comments  Nustep  Lvl 2 x 15 mins   Rockerboard 5 mins   Standing Hip Abduction 20 reps bil   Standing Hip Extension 20 reps bil   LAQs 3# x 20 reps bil   Seated Marches 3# x 20 reps bil   Seated Ham Curls Red x 20 reps bil   SKTC 10 reps x 3 sec hold bil   LTR 10 reps x 3 sec hold bil     Blank cell = exercise not performed today   PATIENT EDUCATION:  Education details: POC, prognosis, anatomy, arthritis, objective findings, and goals for physical therapy Person educated: Patient Education method: Explanation Education comprehension: verbalized understanding  HOME EXERCISE PROGRAM:   ASSESSMENT:  CLINICAL IMPRESSION: Pt arrives for today's treatment session denying any pain.  Pt introduced to Nustep today for warm up without discomfort.  Pt also introduced to several standing and seated exercises today  to increased strength and function while decreasing pain.  Pt requiring min cues for proper technique and posture.  Pt also instructed in supine lumbar stretches with good results.  Pt denied any pain at completion of today's treatment session.    OBJECTIVE IMPAIRMENTS: decreased ROM, hypomobility, impaired tone, and pain.   ACTIVITY LIMITATIONS: sleeping  PARTICIPATION LIMITATIONS: household activities  PERSONAL FACTORS: 1-2 comorbidities: Hypertension and cancer are also affecting patient's functional outcome.   REHAB POTENTIAL: Excellent  CLINICAL DECISION MAKING: Stable/uncomplicated  EVALUATION COMPLEXITY: Low   GOALS: Goals reviewed with patient? Yes  LONG TERM GOALS: Target date: 09/29/23  Patient will be independent with his HEP.  Baseline:  Goal status: INITIAL  2.  Patient will report being able to sleep without being awakened by his familiar low back pain.  Baseline:  Goal status: INITIAL  3.  Patient will improve his ODI score to 2% disability or less for improved perceived function with his daily activities.  Baseline:  Goal status: INITIAL  4.  Patient will be able to complete his daily activities without his familiar symptoms exceeding 3/10. Baseline:  Goal status: INITIAL  PLAN:  PT FREQUENCY: 2x/week  PT DURATION: 4 weeks  PLANNED INTERVENTIONS: 97164- PT Re-evaluation, 97750- Physical Performance Testing, 97110-Therapeutic exercises, 97530- Therapeutic activity, [id number]- Neuromuscular re-education, 97535- Self Care, 16109- Manual therapy, Patient/Family education, Joint mobilization, Spinal mobilization, Cryotherapy, and Moist heat.  PLAN FOR NEXT SESSION: manual therapy, lumbar stabilization, lower trunk rotations, and single/double knee to chest   Deryl Flora, PTA 09/03/2023, 12:34 PM

## 2023-09-07 ENCOUNTER — Ambulatory Visit: Attending: Orthopedic Surgery | Admitting: *Deleted

## 2023-09-07 ENCOUNTER — Encounter: Payer: Self-pay | Admitting: *Deleted

## 2023-09-07 DIAGNOSIS — M5459 Other low back pain: Secondary | ICD-10-CM | POA: Diagnosis present

## 2023-09-07 NOTE — Therapy (Signed)
 OUTPATIENT PHYSICAL THERAPY THORACOLUMBAR TREATMENT   Patient Name: Noah Macdonald MRN: [id number] DOB:04/09/1950, 73 y.o., male Today's Date: 09/07/2023  END OF SESSION:  PT End of Session - 09/07/23 0930     Visit Number 3    Number of Visits 8    Date for PT Re-Evaluation 10/15/23    PT Start Time 0930    PT Stop Time 1020    PT Time Calculation (min) 50 min             History reviewed. No pertinent past medical history. Past Surgical History:  Procedure Laterality Date   CHOLECYSTECTOMY     There are no active problems to display for this patient.  REFERRING PROVIDER: Virl Grimes, MD  REFERRING DIAG: Lumbar facet hypertrophy   Rationale for Evaluation and Treatment: Rehabilitation  THERAPY DIAG:  Other low back pain  ONSET DATE: 1 month ago  SUBJECTIVE:                                                                                                                                                                                           SUBJECTIVE STATEMENT: Pt reports that his back isn't any better. Pain 2/10 today, but at night 5-6/10 and can't sleep for about 2 months.    PERTINENT HISTORY:  Hypertension and cancer  PAIN:  Are you having pain? No  PRECAUTIONS: None  RED FLAGS: None   WEIGHT BEARING RESTRICTIONS: No  FALLS:  Has patient fallen in last 6 months? No  LIVING ENVIRONMENT: Lives with: lives with their spouse Lives in: House/apartment  OCCUPATION: retired  PLOF: Independent  PATIENT GOALS: improved sleep and reduced pain  NEXT MD VISIT: after physical therapy  OBJECTIVE:  Note: Objective measures were completed at Evaluation unless otherwise noted.  PATIENT SURVEYS:  Modified Oswestry 12% disability   COGNITION: Overall cognitive status: Within functional limits for tasks assessed     SENSATION: Patient reports no numbness or tingling  POSTURE: forward head  PALPATION: TTP: right lumbar paraspinals  (familiar pain)   LUMBAR JOINT MOBILITY  L1-4: WFL and nonpainful   L4-5: hypomobile and familiar pain  LUMBAR ROM:   AROM eval  Flexion 44  Extension 18  Right lateral flexion 50% limited  Left lateral flexion 50% limited  Right rotation WFL   Left rotation 25% limited   (Blank rows = not tested)  LOWER EXTREMITY ROM: WFL for activities assessed  LOWER EXTREMITY MMT:    MMT Right eval Left eval  Hip flexion 4+/5 4/5  Hip extension    Hip abduction    Hip adduction    Hip internal  rotation    Hip external rotation    Knee flexion 4+/5 5/5  Knee extension 5/5 5/5  Ankle dorsiflexion 4/5 4/5  Ankle plantarflexion    Ankle inversion    Ankle eversion     (Blank rows = not tested)  GAIT: Assistive device utilized: None Level of assistance: Complete Independence Comments: no significant gait deviations  TREATMENT DATE:  Discussed walking program and trying a 10-15 min walk 2 x daily without LB pain trigger as well as movement patterns with ADL's with AB bracing to decrease pain triggers Nustep L3 x 15 min Hook lying leg raise x 6 hold 5-10   Bridge x 10 hold 5 secs Cat/camel x 12 for mobility and proprioception/ spinal control Pt to buy yoga mat to be able to perform exs in the floor Handouts given for HEP                                                                                                                                 09/03/23                                 EXERCISE LOG  Exercise Repetitions and Resistance Comments  Nustep  Lvl 2 x 15 mins   Rockerboard 5 mins   Standing Hip Abduction 20 reps bil   Standing Hip Extension 20 reps bil   LAQs 3# x 20 reps bil   Seated Marches 3# x 20 reps bil   Seated Ham Curls Red x 20 reps bil   SKTC 10 reps x 3 sec hold bil   LTR 10 reps x 3 sec hold bil     Blank cell = exercise not performed today   PATIENT EDUCATION:  Education details: POC, prognosis, anatomy, arthritis, objective findings, and goals  for physical therapy Person educated: Patient Education method: Explanation Education comprehension: verbalized understanding  HOME EXERCISE PROGRAM:   ASSESSMENT:  CLINICAL IMPRESSION: Pt arrives for today's treatment session reporting that his LB is doing about the same with pain mainly at night and when washing His car. We discussed AB bracing and changing his movement patterns to decrease pain triggers and see if his pain is less at night. We a;lso performed core stabilization exs as well as cat/camel for mobility and proprioception training and did fairly well.   OBJECTIVE IMPAIRMENTS: decreased ROM, hypomobility, impaired tone, and pain.   ACTIVITY LIMITATIONS: sleeping  PARTICIPATION LIMITATIONS: household activities  PERSONAL FACTORS: 1-2 comorbidities: Hypertension and cancer are also affecting patient's functional outcome.   REHAB POTENTIAL: Excellent  CLINICAL DECISION MAKING: Stable/uncomplicated  EVALUATION COMPLEXITY: Low   GOALS: Goals reviewed with patient? Yes  LONG TERM GOALS: Target date: 09/29/23  Patient will be independent with his HEP.  Baseline:  Goal status: INITIAL  2.  Patient will report being able to sleep without being awakened by his familiar low  back pain.  Baseline:  Goal status: INITIAL  3.  Patient will improve his ODI score to 2% disability or less for improved perceived function with his daily activities.  Baseline:  Goal status: INITIAL  4.  Patient will be able to complete his daily activities without his familiar symptoms exceeding 3/10. Baseline:  Goal status: INITIAL  PLAN:  PT FREQUENCY: 2x/week  PT DURATION: 4 weeks  PLANNED INTERVENTIONS: 97164- PT Re-evaluation, 97750- Physical Performance Testing, 97110-Therapeutic exercises, 97530- Therapeutic activity, [id number]- Neuromuscular re-education, 97535- Self Care, 40981- Manual therapy, Patient/Family education, Joint mobilization, Spinal mobilization, Cryotherapy, and  Moist heat.  PLAN FOR NEXT SESSION: manual therapy, lumbar stabilization, and single/double knee to chest   Amyra Vantuyl,CHRIS, PTA 09/07/2023, 1:08 PM

## 2023-09-10 ENCOUNTER — Encounter: Admitting: *Deleted

## 2023-09-15 ENCOUNTER — Ambulatory Visit: Admitting: *Deleted

## 2023-09-15 NOTE — Therapy (Signed)
 [facility] Outpatient Rehabilitation at [facility] [address] [place], [place], [address] Phone: [phone]   Fax:  [phone]  Patient Details  Name: Noah Macdonald MRN: [id number] Date of Birth: 1950/05/09 Referring Provider:  Tura Gaines, MD  Encounter Date: 09/15/2023  Pt  arrived today , but no Rx for LBP due to injury to RT shldr and rescheduled for next week so arrived , but no charge.  Chyna Kneece,CHRIS, PTA 09/15/2023, 10:35 AM  [facility] Outpatient Rehabilitation at [facility] [address] [place], [place], [address] Phone: [phone]   Fax:  [phone]

## 2023-10-22 ENCOUNTER — Encounter: Payer: Self-pay | Admitting: Advanced Practice Midwife
